# Patient Record
Sex: Female | Born: 1950 | Race: White | Hispanic: No | Marital: Single | Smoking: Never smoker
Health system: Southern US, Community
[De-identification: ages and names within clinical notes are randomized; demographics above are authoritative.]

## PROBLEM LIST (undated history)

## (undated) HISTORY — PX: EXCISION OF BREAST BIOPSY: SHX5822

---

## 2018-04-27 DIAGNOSIS — C439 Malignant melanoma of skin, unspecified: Secondary | ICD-10-CM

## 2018-04-27 DIAGNOSIS — D239 Other benign neoplasm of skin, unspecified: Secondary | ICD-10-CM

## 2018-04-27 HISTORY — DX: Other benign neoplasm of skin, unspecified: D23.9

## 2018-04-27 HISTORY — DX: Malignant melanoma of skin, unspecified: C43.9

## 2020-01-03 ENCOUNTER — Ambulatory Visit: Payer: Self-pay | Admitting: Dermatology

## 2020-01-10 ENCOUNTER — Ambulatory Visit (INDEPENDENT_AMBULATORY_CARE_PROVIDER_SITE_OTHER): Payer: Medicare (Managed Care) | Admitting: Dermatology

## 2020-01-10 ENCOUNTER — Other Ambulatory Visit: Payer: Self-pay

## 2020-01-10 DIAGNOSIS — Z85820 Personal history of malignant melanoma of skin: Secondary | ICD-10-CM

## 2020-01-10 DIAGNOSIS — D229 Melanocytic nevi, unspecified: Secondary | ICD-10-CM

## 2020-01-10 DIAGNOSIS — Z1283 Encounter for screening for malignant neoplasm of skin: Secondary | ICD-10-CM

## 2020-01-10 DIAGNOSIS — L578 Other skin changes due to chronic exposure to nonionizing radiation: Secondary | ICD-10-CM

## 2020-01-10 DIAGNOSIS — L308 Other specified dermatitis: Secondary | ICD-10-CM

## 2020-01-10 DIAGNOSIS — L738 Other specified follicular disorders: Secondary | ICD-10-CM

## 2020-01-10 DIAGNOSIS — L814 Other melanin hyperpigmentation: Secondary | ICD-10-CM

## 2020-01-10 DIAGNOSIS — L821 Other seborrheic keratosis: Secondary | ICD-10-CM

## 2020-01-10 DIAGNOSIS — L820 Inflamed seborrheic keratosis: Secondary | ICD-10-CM

## 2020-01-10 DIAGNOSIS — D1800 Hemangioma unspecified site: Secondary | ICD-10-CM

## 2020-01-10 DIAGNOSIS — L304 Erythema intertrigo: Secondary | ICD-10-CM

## 2020-01-10 MED ORDER — KETOCONAZOLE 2 % EX CREA: 1.0000 "application " | TOPICAL_CREAM | Freq: Two times a day (BID) | CUTANEOUS | 1 refills | Status: AC

## 2020-01-10 MED ORDER — TRIAMCINOLONE ACETONIDE 0.1 % EX OINT: TOPICAL_OINTMENT | CUTANEOUS | 1 refills | Status: DC

## 2020-01-10 NOTE — Progress Notes (Signed)
Follow-Up Visit   Subjective  Penny Gutierrez is a 69 y.o. female who presents for the following: Annual Exam (history of MM, DN).  Patient here today for full body skin exam and skin cancer screening. She has a history of melanoma and dysplastic nevi. Spot at left chest has gotten bigger. There are some spots on her neck and chest that itch and burn. Rash at left arm that is itchy present for a few weeks.   Melanoma was treated by Dr. Johnnette Litter at [facility].   The following portions of the chart were reviewed this encounter and updated as appropriate:  Tobacco  Allergies  Meds  Problems  Med Hx  Surg Hx  Fam Hx      Review of Systems:  No other skin or systemic complaints except as noted in HPI or Assessment and Plan.  Objective  Well appearing patient in no apparent distress; mood and affect are within normal limits.  A full examination was performed including scalp, head, eyes, ears, nose, lips, neck, chest, axillae, abdomen, back, buttocks, bilateral upper extremities, bilateral lower extremities, hands, feet, fingers, toes, fingernails, and toenails. All findings within normal limits unless otherwise noted below.  Objective  R chest x 1, L neck x 1, ant neck x 1, L thigh x 4 (7): Erythematous keratotic or waxy stuck-on papule or plaque.   Objective  Left Antecubital Fossa: Scaly erythematous papules and patches +/- dyspigmentation, lichenification, excoriations.   Objective  Inframammary: Erythematous patches  Objective  Face: Yellow papules   Assessment & Plan  Inflamed seborrheic keratosis (7) R chest x 1, L neck x 1, ant neck x 1, L thigh x 4  Destruction of lesion - R chest x 1, L neck x 1, ant neck x 1, L thigh x 4  Destruction method: cryotherapy   Informed consent: discussed and consent obtained   Lesion destroyed using liquid nitrogen: Yes   Cryotherapy cycles:  2 Outcome: patient tolerated procedure well with no complications     Post-procedure details: wound care instructions given    Other eczema Left Antecubital Fossa  Start TMC 0.1% ointment to affected area twice daily until clear. Avoid face, groin, underarms.   Topical steroids (such as triamcinolone, fluocinolone, fluocinonide, mometasone, clobetasol, halobetasol, betamethasone, hydrocortisone) can cause thinning and lightening of the skin if they are used for too long in the same area. Your physician has selected the right strength medicine for your problem and area affected on the body. Please use your medication only as directed by your physician to prevent side effects.    Ordered Medications: triamcinolone ointment (KENALOG) 0.1 %  Erythema intertrigo Inframammary  Start TMC 0.1% ointment twice a day to affected areas chest up to 2-3 weeks, under breasts up to 1 week. Avoid face, groin, underarms.   Start ketoconazole 2% cream twice a day to under breasts  Ordered Medications: ketoconazole (NIZORAL) 2 % cream  Sebaceous hyperplasia Face  Benign, observe.     Lentigines - Scattered tan macules - Discussed due to sun exposure - Benign, observe - Call for any changes  Seborrheic Keratoses - Stuck-on, waxy, tan-brown papules and plaques  - Discussed benign etiology and prognosis. - Observe - Call for any changes  Melanocytic Nevi - Tan-brown and/or pink-flesh-colored symmetric macules and papules - Benign appearing on exam today - Observation - Call clinic for new or changing moles - Recommend daily use of broad spectrum spf 30+ sunscreen to sun-exposed areas.   Hemangiomas -  Red papules - Discussed benign nature - Observe - Call for any changes  Actinic Damage - diffuse scaly erythematous macules with underlying dyspigmentation - Recommend daily broad spectrum sunscreen SPF 30+ to sun-exposed areas, reapply every 2 hours as needed.  - Call for new or changing lesions.  Skin cancer screening performed today.  History of  Melanoma - No evidence of recurrence today at left chest, Breslow's Depth 0.66mm treated at [facility] by Dr. Dorita Fray - No lymphadenopathy - Recommend regular full body skin exams - Recommend daily broad spectrum sunscreen SPF 30+ to sun-exposed areas, reapply every 2 hours as needed.  - Call if any new or changing lesions are noted between office visits  History of Dysplastic Nevi - No evidence of recurrence today ar right upper arm - Recommend regular full body skin exams - Recommend daily broad spectrum sunscreen SPF 30+ to sun-exposed areas, reapply every 2 hours as needed.  - Call if any new or changing lesions are noted between office visits   Return in about 4 months (around 05/11/2020) for TBSE.  Graciella Belton, RMA, am acting as scribe for Forest Gleason, MD .  Documentation: I have reviewed the above documentation for accuracy and completeness, and I agree with the above.  Forest Gleason, MD

## 2020-01-10 NOTE — Patient Instructions (Addendum)
Cryotherapy Aftercare  . Wash gently with soap and water everyday.   Marland Kitchen Apply Vaseline and Band-Aid daily until healed.  Seborrheic Keratosis  What causes seborrheic keratoses? Seborrheic keratoses are harmless, common skin growths that first appear during adult life.  As time goes by, more growths appear.  Some people may develop a large number of them.  Seborrheic keratoses appear on both covered and uncovered body parts.  They are not caused by sunlight.  The tendency to develop seborrheic keratoses can be inherited.  They vary in color from skin-colored to gray, brown, or even black.  They can be either smooth or have a rough, warty surface.   Seborrheic keratoses are superficial and look as if they were stuck on the skin.  Under the microscope this type of keratosis looks like layers upon layers of skin.  That is why at times the top layer may seem to fall off, but the rest of the growth remains and re-grows.    Treatment Seborrheic keratoses do not need to be treated, but can easily be removed in the office.  Seborrheic keratoses often cause symptoms when they rub on clothing or jewelry.  Lesions can be in the way of shaving.  If they become inflamed, they can cause itching, soreness, or burning.  Removal of a seborrheic keratosis can be accomplished by freezing, burning, or surgery. If any spot bleeds, scabs, or grows rapidly, please return to have it checked, as these can be an indication of a skin cancer.  Recommend daily broad spectrum sunscreen SPF 30+ to sun-exposed areas, reapply every 2 hours as needed. Call for new or changing lesions.  Topical steroids (such as triamcinolone, fluocinolone, fluocinonide, mometasone, clobetasol, halobetasol, betamethasone, hydrocortisone) can cause thinning and lightening of the skin if they are used for too long in the same area. Your physician has selected the right strength medicine for your problem and area affected on the body. Please use your  medication only as directed by your physician to prevent side effects.

## 2020-01-27 ENCOUNTER — Encounter: Payer: Self-pay | Admitting: Dermatology

## 2020-05-07 ENCOUNTER — Other Ambulatory Visit: Payer: Self-pay

## 2020-05-07 ENCOUNTER — Encounter: Payer: Self-pay | Admitting: Dermatology

## 2020-05-07 ENCOUNTER — Ambulatory Visit (INDEPENDENT_AMBULATORY_CARE_PROVIDER_SITE_OTHER): Payer: Medicare (Managed Care) | Admitting: Dermatology

## 2020-05-07 DIAGNOSIS — L304 Erythema intertrigo: Secondary | ICD-10-CM

## 2020-05-07 DIAGNOSIS — L905 Scar conditions and fibrosis of skin: Secondary | ICD-10-CM

## 2020-05-07 DIAGNOSIS — Z1283 Encounter for screening for malignant neoplasm of skin: Secondary | ICD-10-CM

## 2020-05-07 DIAGNOSIS — D1800 Hemangioma unspecified site: Secondary | ICD-10-CM

## 2020-05-07 DIAGNOSIS — L309 Dermatitis, unspecified: Secondary | ICD-10-CM

## 2020-05-07 DIAGNOSIS — Z85820 Personal history of malignant melanoma of skin: Secondary | ICD-10-CM

## 2020-05-07 DIAGNOSIS — D229 Melanocytic nevi, unspecified: Secondary | ICD-10-CM

## 2020-05-07 DIAGNOSIS — L578 Other skin changes due to chronic exposure to nonionizing radiation: Secondary | ICD-10-CM

## 2020-05-07 DIAGNOSIS — D2262 Melanocytic nevi of left upper limb, including shoulder: Secondary | ICD-10-CM

## 2020-05-07 DIAGNOSIS — L821 Other seborrheic keratosis: Secondary | ICD-10-CM

## 2020-05-07 DIAGNOSIS — Z86018 Personal history of other benign neoplasm: Secondary | ICD-10-CM

## 2020-05-07 DIAGNOSIS — L814 Other melanin hyperpigmentation: Secondary | ICD-10-CM

## 2020-05-07 MED ORDER — TRIAMCINOLONE ACETONIDE 0.1 % EX OINT: TOPICAL_OINTMENT | CUTANEOUS | 3 refills | Status: DC

## 2020-05-07 MED ORDER — HYDROCORTISONE 2.5 % EX CREA: TOPICAL_CREAM | CUTANEOUS | 3 refills | Status: AC

## 2020-05-07 NOTE — Progress Notes (Signed)
Follow-Up Visit   Subjective  Penny Gutierrez is a 69 y.o. female who presents for the following: TBSE (4 mo TBSE. Hx of melanoma. Hx of dysplastic nevi. ).  She notes itchy rash at her neck, chest and ears present for awhile, bothersome.  The following portions of the chart were reviewed this encounter and updated as appropriate:  Tobacco  Allergies  Meds  Problems  Med Hx  Surg Hx  Fam Hx      Review of Systems: No other skin or systemic complaints except as noted in HPI or Assessment and Plan.   Objective  Well appearing patient in no apparent distress; mood and affect are within normal limits.  A full examination was performed including scalp, head, eyes, ears, nose, lips, neck, chest, axillae, abdomen, back, buttocks, bilateral upper extremities, bilateral lower extremities, hands, feet, fingers, toes, fingernails, and toenails. All findings within normal limits unless otherwise noted below.  Objective  Left Upper Arm: Well healed scar.  Objective  Left upper inner arm: 0.7 cm thin dark brown papule   Images      Objective  Neck, chest, ears: Scaly erythematous papules and patches  Objective  bilateral inframammary: Erythematous patches  Assessment & Plan  Scar Left Upper Arm  Benign-appearing.  Observation.  Call clinic for new or changing lesions.  Recommend daily use of broad spectrum spf 30+ sunscreen to sun-exposed areas.    Nevus Left upper inner arm  Benign-appearing.  Observation.  Call clinic for new or changing lesions.  Recommend daily use of broad spectrum spf 30+ sunscreen to sun-exposed areas.   Recheck on f/u  Eczema, unspecified type Neck, chest, ears  Chronic condition with expected duration over one year. Condition is bothersome to patient. Currently flared.  Start TMC 0.1% ointment. Apply to affected areas on neck, chest, and ears BID for up to 2 weeks at a time. Stop if rash resolves or itching subsides. Repeat as needed.  Do not use for more than 2 weeks out of the month total each month.   Topical steroids (such as triamcinolone, fluocinolone, fluocinonide, mometasone, clobetasol, halobetasol, betamethasone, hydrocortisone) can cause thinning and lightening of the skin if they are used for too long in the same area. Your physician has selected the right strength medicine for your problem and area affected on the body. Please use your medication only as directed by your physician to prevent side effects.     triamcinolone ointment (KENALOG) 0.1 % - Neck, chest, ears  Erythema intertrigo bilateral inframammary  Start HC 2.5% cream BID to bilateral inframammary area for up to 2 weeks at a time. Stop if rash resolves. Repeat as needed. Do not use for more than 2 weeks total out of each month.      hydrocortisone 2.5 % cream - bilateral inframammary    Lentigines - Scattered tan macules - Discussed due to sun exposure - Benign, observe - Call for any changes  Seborrheic Keratoses - Stuck-on, waxy, tan-brown papules and plaques  - Discussed benign etiology and prognosis. - Observe - Call for any changes  Melanocytic Nevi - Tan-brown and/or pink-flesh-colored symmetric macules and papules - Benign appearing on exam today - Observation - Call clinic for new or changing moles - Recommend daily use of broad spectrum spf 30+ sunscreen to sun-exposed areas.   Hemangiomas - Red papules - Discussed benign nature - Observe - Call for any changes  Actinic Damage - Chronic, secondary to cumulative UV/sun exposure - diffuse scaly erythematous  macules with underlying dyspigmentation - Recommend daily broad spectrum sunscreen SPF 30+ to sun-exposed areas, reapply every 2 hours as needed.  - Call for new or changing lesions.  Skin cancer screening performed today.  History of Melanoma - No evidence of recurrence today at left chest, Breslow's Depth 0.33mm treated at [facility] by Dr. Dorita Fray -  No evidence of recurrence today - No lymphadenopathy - Recommend regular full body skin exams - Recommend daily broad spectrum sunscreen SPF 30+ to sun-exposed areas, reapply every 2 hours as needed.  - Call if any new or changing lesions are noted between office visits  History of Dysplastic Nevi - No evidence of recurrence today - Recommend regular full body skin exams - Recommend daily broad spectrum sunscreen SPF 30+ to sun-exposed areas, reapply every 2 hours as needed.  - Call if any new or changing lesions are noted between office visits   Return in about 6 months (around 11/05/2020) for TBSE.   I, Harriett Sine, CMA, am acting as scribe for Forest Gleason, MD.  Documentation: I have reviewed the above documentation for accuracy and completeness, and I agree with the above.  Forest Gleason, MD

## 2020-05-07 NOTE — Patient Instructions (Addendum)
Recommend Gold Bond Rapid Relief Anti-Itch cream up to 3 times per day to areas that are itchy.   Start HC 2.5% cream BID to bilateral inframammaries for up to 2 weeks at a time. Stop if rash resolves. Repeat as needed. Do not use for more than 2 weeks total out of each month.    Start TMC 0.1% ointment. Apply to affected areas on neck, chest, and ears BID for up to 2 weeks at a time. Stop if rash resolves or itching subsides. Repeat as needed. Do not use for more than 2 weeks out of the month total each month.

## 2020-06-04 ENCOUNTER — Encounter: Payer: Self-pay | Admitting: Dermatology

## 2020-10-01 ENCOUNTER — Other Ambulatory Visit: Payer: Self-pay

## 2020-10-01 ENCOUNTER — Ambulatory Visit (INDEPENDENT_AMBULATORY_CARE_PROVIDER_SITE_OTHER): Payer: Medicare (Managed Care) | Admitting: Dermatology

## 2020-10-01 ENCOUNTER — Encounter: Payer: Self-pay | Admitting: Dermatology

## 2020-10-01 DIAGNOSIS — L570 Actinic keratosis: Secondary | ICD-10-CM | POA: Diagnosis not present

## 2020-10-01 DIAGNOSIS — Z1283 Encounter for screening for malignant neoplasm of skin: Secondary | ICD-10-CM | POA: Diagnosis not present

## 2020-10-01 DIAGNOSIS — L814 Other melanin hyperpigmentation: Secondary | ICD-10-CM

## 2020-10-01 DIAGNOSIS — Z86018 Personal history of other benign neoplasm: Secondary | ICD-10-CM

## 2020-10-01 DIAGNOSIS — D2262 Melanocytic nevi of left upper limb, including shoulder: Secondary | ICD-10-CM | POA: Diagnosis not present

## 2020-10-01 DIAGNOSIS — Z85820 Personal history of malignant melanoma of skin: Secondary | ICD-10-CM | POA: Diagnosis not present

## 2020-10-01 DIAGNOSIS — D229 Melanocytic nevi, unspecified: Secondary | ICD-10-CM

## 2020-10-01 DIAGNOSIS — D1800 Hemangioma unspecified site: Secondary | ICD-10-CM

## 2020-10-01 DIAGNOSIS — L578 Other skin changes due to chronic exposure to nonionizing radiation: Secondary | ICD-10-CM

## 2020-10-01 DIAGNOSIS — L821 Other seborrheic keratosis: Secondary | ICD-10-CM

## 2020-10-01 NOTE — Progress Notes (Signed)
Follow-Up Visit   Subjective  Penny Gutierrez is a 70 y.o. female who presents for the following: FBSE (Patient here for full body skin exam and skin cancer screening. Patient with hx of MM and dysplastic nevi. She is not aware of any new or changing spots. ). She does have a spot at the right ear that is bothersome.  Patient accompanied by niece Nathaniel Man.  The following portions of the chart were reviewed this encounter and updated as appropriate:   Tobacco  Allergies  Meds  Problems  Med Hx  Surg Hx  Fam Hx      Review of Systems:  No other skin or systemic complaints except as noted in HPI or Assessment and Plan.  Objective  Well appearing patient in no apparent distress; mood and affect are within normal limits.  A full examination was performed including scalp, head, eyes, ears, nose, lips, neck, chest, axillae, abdomen, back, buttocks, bilateral upper extremities, bilateral lower extremities, hands, feet, fingers, toes, fingernails, and toenails. All findings within normal limits unless otherwise noted below.  Objective  Left Upper Arm near Axilla: 0.7cm medium brown to dark brown thin papule,slightly darker centrally  Objective  Right helix: Erythematous thin papules/macules with gritty scale.    Assessment & Plan  Nevus Left Upper Arm near Axilla  Benign-appearing.  Observation.  Call clinic for new or changing lesions.  Recommend daily use of broad spectrum spf 30+ sunscreen to sun-exposed areas.    AK (actinic keratosis) Right helix  Prior to procedure, discussed risks of blister formation, small wound, skin dyspigmentation, or rare scar following cryotherapy.    Destruction of lesion - Right helix  Destruction method: cryotherapy   Informed consent: discussed and consent obtained   Lesion destroyed using liquid nitrogen: Yes   Cryotherapy cycles:  2 Outcome: patient tolerated procedure well with no complications   Post-procedure details:  wound care instructions given     Lentigines - Scattered tan macules - Due to sun exposure - Benign-appering, observe - Recommend daily broad spectrum sunscreen SPF 30+ to sun-exposed areas, reapply every 2 hours as needed. - Call for any changes  Seborrheic Keratoses - Stuck-on, waxy, tan-brown papules and/or plaques  - Benign-appearing - Discussed benign etiology and prognosis. - Observe - Call for any changes  Melanocytic Nevi - Tan-brown and/or pink-flesh-colored symmetric macules and papules - Benign appearing on exam today - Observation - Call clinic for new or changing moles - Recommend daily use of broad spectrum spf 30+ sunscreen to sun-exposed areas.   Hemangiomas - Red papules - Discussed benign nature - Observe - Call for any changes  Actinic Damage - Chronic condition, secondary to cumulative UV/sun exposure - diffuse scaly erythematous macules with underlying dyspigmentation - Recommend daily broad spectrum sunscreen SPF 30+ to sun-exposed areas, reapply every 2 hours as needed.  - Staying in the shade or wearing long sleeves, sun glasses (UVA+UVB protection) and wide brim hats (4-inch brim around the entire circumference of the hat) are also recommended for sun protection.  - Call for new or changing lesions.  Skin cancer screening performed today.  History of Dysplastic Nevi - No evidence of recurrence today - Recommend regular full body skin exams - Recommend daily broad spectrum sunscreen SPF 30+ to sun-exposed areas, reapply every 2 hours as needed.  - Call if any new or changing lesions are noted between office visits  History of Melanoma - No evidence of recurrence today at left chest, Breslow's 0.10mm, clark's level  2 - No lymphadenopathy - Recommend regular full body skin exams - Recommend daily broad spectrum sunscreen SPF 30+ to sun-exposed areas, reapply every 2 hours as needed.  - Call if any new or changing lesions are noted between office  visits   Return in about 3 months (around 12/31/2020) for AK follow up and recheck nevus at left arm, 6 month TBSE.  Graciella Belton, RMA, am acting as scribe for Forest Gleason, MD .  Documentation: I have reviewed the above documentation for accuracy and completeness, and I agree with the above.  Forest Gleason, MD

## 2020-10-01 NOTE — Patient Instructions (Addendum)
Recommend OTC Gold Bond Rapid Relief Anti-Itch cream (pramoxine + menthol) up to 3 times per day to areas that are itchy.  Cryotherapy Aftercare  . Wash gently with soap and water everyday.   Marland Kitchen Apply Vaseline and Band-Aid daily until healed.  Prior to procedure, discussed risks of blister formation, small wound, skin dyspigmentation, or rare scar following cryotherapy.    Melanoma ABCDEs  Melanoma is the most dangerous type of skin cancer, and is the leading cause of death from skin disease.  You are more likely to develop melanoma if you:  Have light-colored skin, light-colored eyes, or red or blond hair  Spend a lot of time in the sun  Tan regularly, either outdoors or in a tanning bed  Have had blistering sunburns, especially during childhood  Have a close family member who has had a melanoma  Have atypical moles or large birthmarks  Early detection of melanoma is key since treatment is typically straightforward and cure rates are extremely high if we catch it early.   The first sign of melanoma is often a change in a mole or a new dark spot.  The ABCDE system is a way of remembering the signs of melanoma.  A for asymmetry:  The two halves do not match. B for border:  The edges of the growth are irregular. C for color:  A mixture of colors are present instead of an even brown color. D for diameter:  Melanomas are usually (but not always) greater than 74mm - the size of a pencil eraser. E for evolution:  The spot keeps changing in size, shape, and color.  Please check your skin once per month between visits. You can use a small mirror in front and a large mirror behind you to keep an eye on the back side or your body.   If you see any new or changing lesions before your next follow-up, please call to schedule a visit.  Please continue daily skin protection including broad spectrum sunscreen SPF 30+ to sun-exposed areas, reapplying every 2 hours as needed when you're outdoors.    If you have any questions or concerns for your doctor, please call our main line at [phone] and press option 4 to reach your doctor's medical assistant. If no one answers, please leave a voicemail as directed and we will return your call as soon as possible. Messages left after 4 pm will be answered the following business day.   You may also send [place] a message via [place]. We typically respond to MyChart messages within 1-2 business days.  For prescription refills, please ask your pharmacy to contact our office. Our fax number is [phone].  If you have an urgent issue when the clinic is closed that cannot wait until the next business day, you can page your doctor at the number below.    Please note that while we do our best to be available for urgent issues outside of office hours, we are not available 24/7.   If you have an urgent issue and are unable to reach [place], you may choose to seek medical care at your doctor's office, retail clinic, urgent care center, or emergency room.  If you have a medical emergency, please immediately call 911 or go to the emergency department.  Pager Numbers  - Dr. Nehemiah Massed: [phone]  - Dr. Laurence Ferrari: [phone]  - Dr. Nicole Kindred: [phone]  In the event of inclement weather, please call our main line at [phone] for an update on the  status of any delays or closures.  Dermatology Medication Tips: Please keep the boxes that topical medications come in in order to help keep track of the instructions about where and how to use these. Pharmacies typically print the medication instructions only on the boxes and not directly on the medication tubes.   If your medication is too expensive, please contact our office at [phone] option 4 or send [place] a message through [place].   We are unable to tell what your co-pay for medications will be in advance as this is different depending on your insurance coverage. However, we may be able to find a  substitute medication at lower cost or fill out paperwork to get insurance to cover a needed medication.   If a prior authorization is required to get your medication covered by your insurance company, please allow [place] 1-2 business days to complete this process.  Drug prices often vary depending on where the prescription is filled and some pharmacies may offer cheaper prices.  The website www.goodrx.com contains coupons for medications through different pharmacies. The prices here do not account for what the cost may be with help from insurance (it may be cheaper with your insurance), but the website can give you the price if you did not use any insurance.  - You can print the associated coupon and take it with your prescription to the pharmacy.  - You may also stop by our office during regular business hours and pick up a GoodRx coupon card.  - If you need your prescription sent electronically to a different pharmacy, notify our office through [facility] or by phone at [phone] option 4.

## 2020-12-29 ENCOUNTER — Other Ambulatory Visit: Payer: Self-pay | Admitting: Internal Medicine

## 2020-12-29 DIAGNOSIS — Z1231 Encounter for screening mammogram for malignant neoplasm of breast: Secondary | ICD-10-CM

## 2021-01-14 ENCOUNTER — Encounter: Payer: Self-pay | Admitting: Dermatology

## 2021-01-14 ENCOUNTER — Other Ambulatory Visit: Payer: Self-pay

## 2021-01-14 ENCOUNTER — Ambulatory Visit (INDEPENDENT_AMBULATORY_CARE_PROVIDER_SITE_OTHER): Payer: Medicare (Managed Care) | Admitting: Dermatology

## 2021-01-14 DIAGNOSIS — D2262 Melanocytic nevi of left upper limb, including shoulder: Secondary | ICD-10-CM | POA: Diagnosis not present

## 2021-01-14 DIAGNOSIS — D492 Neoplasm of unspecified behavior of bone, soft tissue, and skin: Secondary | ICD-10-CM

## 2021-01-14 DIAGNOSIS — D229 Melanocytic nevi, unspecified: Secondary | ICD-10-CM

## 2021-01-14 DIAGNOSIS — D485 Neoplasm of uncertain behavior of skin: Secondary | ICD-10-CM

## 2021-01-14 DIAGNOSIS — L570 Actinic keratosis: Secondary | ICD-10-CM

## 2021-01-14 NOTE — Patient Instructions (Signed)

## 2021-01-14 NOTE — Progress Notes (Signed)
   Follow-Up Visit   Subjective  Penny Gutierrez is a 70 y.o. female who presents for the following: Follow-up (3 month recheck. AK Tx site. Right helix. Tx with LN2) and Nevus (Recheck mole. Left Upper Arm near Axilla.).  Sister-in-law with patient  The following portions of the chart were reviewed this encounter and updated as appropriate:  Tobacco  Allergies  Meds  Problems  Med Hx  Surg Hx  Fam Hx      Review of Systems: No other skin or systemic complaints except as noted in HPI or Assessment and Plan.   Objective  Well appearing patient in no apparent distress; mood and affect are within normal limits.  A focused examination was performed including face, left upper arm. Relevant physical exam findings are noted in the Assessment and Plan.  left upper arm near axilla 0.7cm medium brown to dark brown thin papule,slightly darker centrally at left upper arm near axilla   Right Helix Clear today.  Right Thigh - Anterior 0.2 cm excoriation with prominent telangietasias       Assessment & Plan  Nevus left upper arm near axilla  Benign-appearing.  Observation.  Call clinic for new or changing lesions.  Recommend daily use of broad spectrum spf 30+ sunscreen to sun-exposed areas.    AK (actinic keratosis) Right Helix  Resolved, watch for recurrence.   Actinic keratoses are precancerous spots that appear secondary to cumulative UV radiation exposure/sun exposure over time. They are chronic with expected duration over 1 year. A portion of actinic keratoses will progress to squamous cell carcinoma of the skin. It is not possible to reliably predict which spots will progress to skin cancer and so treatment is recommended to prevent development of skin cancer.  Recommend daily broad spectrum sunscreen SPF 30+ to sun-exposed areas, reapply every 2 hours as needed.  Recommend staying in the shade or wearing long sleeves, sun glasses (UVA+UVB protection) and wide brim hats  (4-inch brim around the entire circumference of the hat). Call for new or changing lesions.  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 Deferred today. Will recheck at TBSE in 3 months.    Return for TBSE in 3 months.  I, Emelia Salisbury, CMA, am acting as scribe for Forest Gleason, MD.  Documentation: I have reviewed the above documentation for accuracy and completeness, and I agree with the above.  Forest Gleason, MD

## 2021-01-21 ENCOUNTER — Other Ambulatory Visit: Payer: Self-pay

## 2021-01-21 ENCOUNTER — Ambulatory Visit
Admission: RE | Admit: 2021-01-21 | Discharge: 2021-01-21 | Disposition: A | Discharge status: Home / Self Care | Payer: Medicare (Managed Care) | Source: Ambulatory Visit | Attending: Internal Medicine | Admitting: Internal Medicine

## 2021-01-21 DIAGNOSIS — Z1231 Encounter for screening mammogram for malignant neoplasm of breast: Secondary | ICD-10-CM | POA: Diagnosis present

## 2021-01-23 ENCOUNTER — Inpatient Hospital Stay
Admission: RE | Admit: 2021-01-23 | Discharge: 2021-01-23 | Disposition: A | Discharge status: Home / Self Care | Payer: Self-pay | Source: Ambulatory Visit | Attending: *Deleted | Admitting: *Deleted

## 2021-01-23 ENCOUNTER — Other Ambulatory Visit: Payer: Self-pay | Admitting: *Deleted

## 2021-01-23 DIAGNOSIS — Z1231 Encounter for screening mammogram for malignant neoplasm of breast: Secondary | ICD-10-CM

## 2021-01-24 ENCOUNTER — Encounter: Payer: Self-pay | Admitting: Dermatology

## 2021-04-23 ENCOUNTER — Ambulatory Visit (INDEPENDENT_AMBULATORY_CARE_PROVIDER_SITE_OTHER): Payer: Medicare (Managed Care) | Admitting: Dermatology

## 2021-04-23 ENCOUNTER — Other Ambulatory Visit: Payer: Self-pay

## 2021-04-23 DIAGNOSIS — D485 Neoplasm of uncertain behavior of skin: Secondary | ICD-10-CM

## 2021-04-23 DIAGNOSIS — D225 Melanocytic nevi of trunk: Secondary | ICD-10-CM

## 2021-04-23 DIAGNOSIS — Z85820 Personal history of malignant melanoma of skin: Secondary | ICD-10-CM

## 2021-04-23 DIAGNOSIS — D229 Melanocytic nevi, unspecified: Secondary | ICD-10-CM

## 2021-04-23 DIAGNOSIS — L814 Other melanin hyperpigmentation: Secondary | ICD-10-CM

## 2021-04-23 DIAGNOSIS — Z1283 Encounter for screening for malignant neoplasm of skin: Secondary | ICD-10-CM

## 2021-04-23 DIAGNOSIS — L578 Other skin changes due to chronic exposure to nonionizing radiation: Secondary | ICD-10-CM | POA: Diagnosis not present

## 2021-04-23 DIAGNOSIS — D1800 Hemangioma unspecified site: Secondary | ICD-10-CM

## 2021-04-23 DIAGNOSIS — L821 Other seborrheic keratosis: Secondary | ICD-10-CM

## 2021-04-23 DIAGNOSIS — D224 Melanocytic nevi of scalp and neck: Secondary | ICD-10-CM

## 2021-04-23 DIAGNOSIS — B078 Other viral warts: Secondary | ICD-10-CM | POA: Diagnosis not present

## 2021-04-23 DIAGNOSIS — L820 Inflamed seborrheic keratosis: Secondary | ICD-10-CM | POA: Diagnosis not present

## 2021-04-23 DIAGNOSIS — Z86018 Personal history of other benign neoplasm: Secondary | ICD-10-CM

## 2021-04-23 NOTE — Progress Notes (Signed)
Follow-Up Visit   Subjective  Penny Gutierrez is a 70 y.o. female who presents for the following: FBSE (Patient here for full body skin exam and skin cancer screening. Patient with hx of melanoma, dysplastic nevus. Patient not aware of any new or changing spots. ).   The following portions of the chart were reviewed this encounter and updated as appropriate:   Tobacco  Allergies  Meds  Problems  Med Hx  Surg Hx  Fam Hx      Review of Systems:  No other skin or systemic complaints except as noted in HPI or Assessment and Plan.  Objective  Well appearing patient in no apparent distress; mood and affect are within normal limits.  A full examination was performed including scalp, head, eyes, ears, nose, lips, neck, chest, axillae, abdomen, back, buttocks, bilateral upper extremities, bilateral lower extremities, hands, feet, fingers, toes, fingernails, and toenails. All findings within normal limits unless otherwise noted below.  left upper arm near axilla 0.7cm medium brown to dark brown thin papule,slightly darker centrally at left upper arm near axilla   left neck Verrucous papules -- Discussed viral etiology and contagion.   intermammary x 6, mid back right of midline x 1 (7) Erythematous keratotic or waxy stuck-on papule or plaque.   left upper back 0.3 cm dark brown thin papule 2 slightly darker foci      Assessment & Plan  Nevus left upper arm near axilla  Photo compared, stable  Benign-appearing.  Observation.  Call clinic for new or changing lesions.  Recommend daily use of broad spectrum spf 30+ sunscreen to sun-exposed areas.    Other viral warts left neck  Discussed viral etiology and risk of spread.  Discussed multiple treatments may be required to clear warts.  Discussed possible post-treatment dyspigmentation and risk of recurrence.  Prior to procedure, discussed risks of blister formation, small wound, skin dyspigmentation, or rare scar following  cryotherapy. Recommend Vaseline ointment to treated areas while healing.   Destruction of lesion - left neck  Destruction method: cryotherapy   Informed consent: discussed and consent obtained   Lesion destroyed using liquid nitrogen: Yes   Cryotherapy cycles:  2 Outcome: patient tolerated procedure well with no complications   Post-procedure details: wound care instructions given    Inflamed seborrheic keratosis intermammary x 6, mid back right of midline x 1  Symptomatic  Prior to procedure, discussed risks of blister formation, small wound, skin dyspigmentation, or rare scar following cryotherapy. Recommend Vaseline ointment to treated areas while healing.   Destruction of lesion - intermammary x 6, mid back right of midline x 1  Destruction method: cryotherapy   Informed consent: discussed and consent obtained   Lesion destroyed using liquid nitrogen: Yes   Cryotherapy cycles:  2 Outcome: patient tolerated procedure well with no complications   Post-procedure details: wound care instructions given    Neoplasm of uncertain behavior of skin left upper back  Epidermal / dermal shaving  Lesion diameter (cm):  0.3 Informed consent: discussed and consent obtained   Timeout: patient name, date of birth, surgical site, and procedure verified   Patient was prepped and draped in usual sterile fashion: area prepped with isopropyl alcohol. Anesthesia: the lesion was anesthetized in a standard fashion   Anesthetic:  1% lidocaine w/ epinephrine 1-100,000 buffered w/ 8.4% NaHCO3 Instrument used: flexible razor blade   Hemostasis achieved with: aluminum chloride   Outcome: patient tolerated procedure well   Post-procedure details: wound care instructions given  Additional details:  Mupirocin and a bandage applied  Specimen 1 - Surgical pathology Differential Diagnosis: r/o Atypia  Check Margins: No 0.3 cm dark brown thin papule 2 slightly darker foci  Lentigines - Scattered  tan macules - Due to sun exposure - Benign-appearing, observe - Recommend daily broad spectrum sunscreen SPF 30+ to sun-exposed areas, reapply every 2 hours as needed. - Call for any changes  Seborrheic Keratoses - Stuck-on, waxy, tan-brown papules and/or plaques  - Benign-appearing - Discussed benign etiology and prognosis. - Observe - Call for any changes  Melanocytic Nevi - Tan-brown and/or pink-flesh-colored symmetric macules and papules - Benign appearing on exam today - Observation - Call clinic for new or changing moles - Recommend daily use of broad spectrum spf 30+ sunscreen to sun-exposed areas.   Hemangiomas - Red papules - Discussed benign nature - Observe - Call for any changes  Actinic Damage - Chronic condition, secondary to cumulative UV/sun exposure - diffuse scaly erythematous macules with underlying dyspigmentation - Recommend daily broad spectrum sunscreen SPF 30+ to sun-exposed areas, reapply every 2 hours as needed.  - Staying in the shade or wearing long sleeves, sun glasses (UVA+UVB protection) and wide brim hats (4-inch brim around the entire circumference of the hat) are also recommended for sun protection.  - Call for new or changing lesions.  Skin cancer screening performed today.  History of Dysplastic Nevi - No evidence of recurrence today - Recommend regular full body skin exams - Recommend daily broad spectrum sunscreen SPF 30+ to sun-exposed areas, reapply every 2 hours as needed.  - Call if any new or changing lesions are noted between office visits  History of Melanoma - No evidence of recurrence today - No lymphadenopathy - Recommend regular full body skin exams - Recommend daily broad spectrum sunscreen SPF 30+ to sun-exposed areas, reapply every 2 hours as needed.  - Call if any new or changing lesions are noted between office visits  Return in about 6 months (around 10/21/2021) for TBSE.  Graciella Belton, RMA, am acting as  scribe for Forest Gleason, MD .  Documentation: I have reviewed the above documentation for accuracy and completeness, and I agree with the above.  Forest Gleason, MD

## 2021-04-23 NOTE — Patient Instructions (Addendum)
Cryotherapy Aftercare  Wash gently with soap and water everyday.   Apply Vaseline and Band-Aid daily until healed.   Prior to procedure, discussed risks of blister formation, small wound, skin dyspigmentation, or rare scar following cryotherapy. Recommend Vaseline ointment to treated areas while healing.   Wound Care Instructions  Cleanse wound gently with soap and water once a day then pat dry with clean gauze. Apply a thing coat of Petrolatum (petroleum jelly, "Vaseline") over the wound (unless you have an allergy to this). We recommend that you use a new, sterile tube of Vaseline. Do not pick or remove scabs. Do not remove the yellow or white "healing tissue" from the base of the wound.  Cover the wound with fresh, clean, nonstick gauze and secure with paper tape. You may use Band-Aids in place of gauze and tape if the would is small enough, but would recommend trimming much of the tape off as there is often too much. Sometimes Band-Aids can irritate the skin.  You should call the office for your biopsy report after 1 week if you have not already been contacted.  If you experience any problems, such as abnormal amounts of bleeding, swelling, significant bruising, significant pain, or evidence of infection, please call the office immediately.  FOR ADULT SURGERY PATIENTS: If you need something for pain relief you may take 1 extra strength Tylenol (acetaminophen) AND 2 Ibuprofen (200mg  each) together every 4 hours as needed for pain. (do not take these if you are allergic to them or if you have a reason you should not take them.) Typically, you may only need pain medication for 1 to 3 days.    Melanoma ABCDEs  Melanoma is the most dangerous type of skin cancer, and is the leading cause of death from skin disease.  You are more likely to develop melanoma if you: Have light-colored skin, light-colored eyes, or red or blond hair Spend a lot of time in the sun Tan regularly, either outdoors or  in a tanning bed Have had blistering sunburns, especially during childhood Have a close family member who has had a melanoma Have atypical moles or large birthmarks  Early detection of melanoma is key since treatment is typically straightforward and cure rates are extremely high if we catch it early.   The first sign of melanoma is often a change in a mole or a new dark spot.  The ABCDE system is a way of remembering the signs of melanoma.  A for asymmetry:  The two halves do not match. B for border:  The edges of the growth are irregular. C for color:  A mixture of colors are present instead of an even brown color. D for diameter:  Melanomas are usually (but not always) greater than 13mm - the size of a pencil eraser. E for evolution:  The spot keeps changing in size, shape, and color.  Please check your skin once per month between visits. You can use a small mirror in front and a large mirror behind you to keep an eye on the back side or your body.   If you see any new or changing lesions before your next follow-up, please call to schedule a visit.  Please continue daily skin protection including broad spectrum sunscreen SPF 30+ to sun-exposed areas, reapplying every 2 hours as needed when you're outdoors.    If You Need Anything After Your Visit  If you have any questions or concerns for your doctor, please call our main line at  [phone] and press option 4 to reach your doctor's medical assistant. If no one answers, please leave a voicemail as directed and we will return your call as soon as possible. Messages left after 4 pm will be answered the following business day.   You may also send [place] a message via [place]. We typically respond to MyChart messages within 1-2 business days.  For prescription refills, please ask your pharmacy to contact our office. Our fax number is [phone].  If you have an urgent issue when the clinic is closed that cannot wait until the next business  day, you can page your doctor at the number below.    Please note that while we do our best to be available for urgent issues outside of office hours, we are not available 24/7.   If you have an urgent issue and are unable to reach [place], you may choose to seek medical care at your doctor's office, retail clinic, urgent care center, or emergency room.  If you have a medical emergency, please immediately call 911 or go to the emergency department.  Pager Numbers  - Dr. Nehemiah Massed: [phone]  - Dr. Laurence Ferrari: [phone]  - Dr. Nicole Kindred: [phone]  In the event of inclement weather, please call our main line at [phone] for an update on the status of any delays or closures.  Dermatology Medication Tips: Please keep the boxes that topical medications come in in order to help keep track of the instructions about where and how to use these. Pharmacies typically print the medication instructions only on the boxes and not directly on the medication tubes.   If your medication is too expensive, please contact our office at [phone] option 4 or send [place] a message through [place].   We are unable to tell what your co-pay for medications will be in advance as this is different depending on your insurance coverage. However, we may be able to find a substitute medication at lower cost or fill out paperwork to get insurance to cover a needed medication.   If a prior authorization is required to get your medication covered by your insurance company, please allow [place] 1-2 business days to complete this process.  Drug prices often vary depending on where the prescription is filled and some pharmacies may offer cheaper prices.  The website www.goodrx.com contains coupons for medications through different pharmacies. The prices here do not account for what the cost may be with help from insurance (it may be cheaper with your insurance), but the website can give you the price if you did not use any  insurance.  - You can print the associated coupon and take it with your prescription to the pharmacy.  - You may also stop by our office during regular business hours and pick up a GoodRx coupon card.  - If you need your prescription sent electronically to a different pharmacy, notify our office through [facility] or by phone at [phone] option 4.     Si Usted Necesita Algo Despus de Su Visita  Tambin puede enviarnos un mensaje a travs de Pharmacist, community. Por lo general respondemos a los mensajes de MyChart en el transcurso de 1 a 2 das hbiles.  Para renovar recetas, por favor pida a su farmacia que se ponga en contacto con nuestra oficina. Harland Dingwall de fax es [place] [phone].  Si tiene un asunto urgente cuando la clnica est cerrada y que no puede esperar hasta el siguiente da hbil, puede llamar/localizar a su doctor(a)  al nmero que aparece a continuacin.   Por favor, tenga en cuenta que aunque hacemos todo lo posible para estar disponibles para asuntos urgentes fuera del horario de [place], no estamos disponibles las 24 horas del da, los 7 das de la [place].   Si tiene un problema urgente y no puede comunicarse con nosotros, puede optar por buscar atencin mdica  en el consultorio de su doctor(a), en una clnica privada, en un centro de atencin urgente o en una sala de emergencias.  Si tiene Engineering geologist, por favor llame inmediatamente al 911 o vaya a la sala de emergencias.  Nmeros de bper  - Dr. Nehemiah Massed: [phone]  - Dra. Moye: [phone]  - Dra. Nicole Kindred: [phone]  En caso de inclemencias del [place], por favor llame a Johnsie Kindred principal al [phone] para una actualizacin sobre el [place] de cualquier retraso o cierre.  Consejos para la medicacin en dermatologa: Por favor, guarde las cajas en las que vienen los medicamentos de uso tpico para ayudarle a seguir las instrucciones sobre dnde y cmo usarlos. Las farmacias  generalmente imprimen las instrucciones del medicamento slo en las cajas y no directamente en los tubos del [place].   Si su medicamento es muy caro, por favor, pngase en contacto con Zigmund Daniel llamando al [phone] y presione la opcin 4 o envenos un mensaje a travs de Pharmacist, community.   No podemos decirle cul ser su copago por los medicamentos por adelantado ya que esto es diferente dependiendo de la cobertura de su seguro. Sin embargo, es posible que podamos encontrar un medicamento sustituto a Electrical engineer un formulario para que el seguro cubra el medicamento que se considera necesario.   Si se requiere una autorizacin previa para que su compaa de seguros [place] su medicamento, por favor permtanos de 1 a 2 das hbiles para completar este proceso.  Los precios de los medicamentos varan con frecuencia dependiendo del Environmental consultant de dnde se surte la receta y alguna farmacias pueden ofrecer precios ms baratos.  El sitio web www.goodrx.com tiene cupones para medicamentos de Airline pilot. Los precios aqu no tienen en cuenta lo que podra costar con la ayuda del seguro (puede ser ms barato con su seguro), pero el sitio web puede darle el precio si no utiliz Research scientist (physical sciences).  - Puede imprimir el cupn correspondiente y llevarlo con su receta a la farmacia.  - Tambin puede pasar por nuestra oficina durante el horario de atencin regular y Charity fundraiser una tarjeta de cupones de GoodRx.  - Si necesita que su receta se enve electrnicamente a una farmacia diferente, informe a nuestra oficina a travs de MyChart de [place] o por telfono llamando al [phone] y presione la opcin 4.

## 2021-05-05 ENCOUNTER — Encounter: Payer: Self-pay | Admitting: Dermatology

## 2021-05-06 ENCOUNTER — Telehealth: Payer: Self-pay

## 2021-05-06 NOTE — Telephone Encounter (Addendum)
  Tried calling patient regarding results. No answer. Left message to call back.    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 On the spectrum from normal mole to melanoma skin cancer, this is in between the two but closer towards a melanoma skin cancer.  - The treatment of choice for severely atypical moles is to cut them out in clinic with an area of normal looking skin around them to get all the atypical cells out. The skin that is removed will be sent to check under the microscope again to be sure it looks completely out.   - People who have a history of atypical moles do have a slightly increased risk of developing melanoma somewhere on the body, so a full body skin exam by a dermatologist is recommended at least once a year. - Monthly self skin checks and daily sun protection are also recommended.  - Please also call if you notice any new or changing spots anywhere else on the body before your follow-up visit.   MAs please call and schedule for excision. Thank you!

## 2021-05-06 NOTE — Telephone Encounter (Signed)
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 On the spectrum from normal mole to melanoma skin cancer, this is in between the two but closer towards a melanoma skin cancer.  - The treatment of choice for severely atypical moles is to cut them out in clinic with an area of normal looking skin around them to get all the atypical cells out. The skin that is removed will be sent to check under the microscope again to be sure it looks completely out.   - People who have a history of atypical moles do have a slightly increased risk of developing melanoma somewhere on the body, so a full body skin exam by a dermatologist is recommended at least once a year. - Monthly self skin checks and daily sun protection are also recommended.  - Please also call if you notice any new or changing spots anywhere else on the body before your follow-up visit.   MAs please call and schedule for excision. Thank you!

## 2021-05-06 NOTE — Telephone Encounter (Signed)
Patient's niece advised of BX information and scheduled for surgery.

## 2021-05-20 ENCOUNTER — Ambulatory Visit (INDEPENDENT_AMBULATORY_CARE_PROVIDER_SITE_OTHER): Payer: Medicare (Managed Care) | Admitting: Dermatology

## 2021-05-20 ENCOUNTER — Other Ambulatory Visit: Payer: Self-pay

## 2021-05-20 DIAGNOSIS — D225 Melanocytic nevi of trunk: Secondary | ICD-10-CM | POA: Diagnosis not present

## 2021-05-20 DIAGNOSIS — D485 Neoplasm of uncertain behavior of skin: Secondary | ICD-10-CM

## 2021-05-20 MED ORDER — MUPIROCIN 2 % EX OINT: 1.0000 "application " | TOPICAL_OINTMENT | Freq: Every day | CUTANEOUS | 0 refills | Status: AC

## 2021-05-20 NOTE — Patient Instructions (Addendum)

## 2021-05-20 NOTE — Progress Notes (Signed)
Follow-Up Visit   Subjective  Penny Gutierrez is a 70 y.o. 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 ).  Patient accompanied by niece, Colletta [place] patient's POA.   The following portions of the chart were reviewed this encounter and updated as appropriate:   Tobacco   Allergies   Meds   Problems   Med Hx   Surg Hx   Fam Hx       Review of Systems:  No other skin or systemic complaints except as noted in HPI or Assessment and Plan.  Objective  Well appearing patient in no apparent distress; mood and affect are within normal limits.  A focused examination was performed including back. Relevant physical exam findings are noted in the Assessment and Plan.  Left Upper Back Healing bx site    Assessment & Plan  Neoplasm of uncertain behavior of skin Left Upper Back  Skin excision  Lesion length (cm):  0.9 Margin per side (cm):  0.5 Total excision diameter (cm):  1.9 Informed consent: discussed and consent obtained   Timeout: patient name, date of birth, surgical site, and procedure verified   Procedure prep:  Patient was prepped and draped in usual sterile fashion Prep type:  Chlorhexidine Anesthesia: the lesion was anesthetized in a standard fashion   Anesthetic:  1% lidocaine w/ epinephrine 1-100,000 buffered w/ 8.4% NaHCO3 (3cc lido w/epi, 9cc 0.25% bupivicaine) Instrument used: #15 blade   Hemostasis achieved with: suture, pressure and electrodesiccation   Outcome: patient tolerated procedure well with no complications   Post-procedure details: wound care instructions given   Additional details:  Mupirocin and a pressure dressing applied  Skin repair Complexity:  Intermediate Final length (cm):  5.8 Informed consent: discussed and consent obtained   Timeout: patient name, date of birth, surgical site, and procedure verified   Procedure  prep:  Patient was prepped and draped in usual sterile fashion Prep type:  Chlorhexidine Anesthesia: the lesion was anesthetized in a standard fashion   Anesthetic:  1% lidocaine w/ epinephrine 1-100,000 local infiltration Reason for type of repair: reduce tension to allow closure, reduce the risk of dehiscence, infection, and necrosis, reduce subcutaneous dead space and avoid a hematoma, allow closure of the large defect, preserve normal anatomy, preserve normal anatomical and functional relationships and enhance both functionality and cosmetic results   Undermining: edges could be approximated without difficulty and edges undermined   Subcutaneous layers (deep stitches):  Suture size:  3-0 Suture type: Vicryl (polyglactin 910) and PDS (polydioxanone)   Stitches:  Buried vertical mattress Fine/surface layer approximation (top stitches):  Suture size:  4-0 Suture type: Prolene (polypropylene)   Suture removal (days):  14 Hemostasis achieved with: suture, pressure and electrodesiccation Outcome: patient tolerated procedure well with no complications   Post-procedure details: wound care instructions given   Additional details:  Mupirocin and a pressure bandage applied   mupirocin ointment (BACTROBAN) 2 % Apply 1 application topically daily. 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  Graciella Belton, RMA, am acting as scribe for Forest Gleason, MD .   Documentation: I have reviewed the above documentation for accuracy and completeness, and I agree with the above.  Forest Gleason, MD

## 2021-05-21 ENCOUNTER — Telehealth: Payer: Self-pay

## 2021-05-21 NOTE — Telephone Encounter (Signed)
Left msg for patient's niece to see how she was doing after yesterday's surgery.Cherly Hensen

## 2021-05-26 ENCOUNTER — Telehealth: Payer: Self-pay

## 2021-05-26 NOTE — Telephone Encounter (Signed)
-----   Message from [place], MD sent at 05/26/2021  3:04 PM EST ----- Skin (M), left upper back NO RESIDUAL DYSPLASTIC NEVUS, MARGINS FREE  Entire lesion appears to be out. No additional treatment needed at this time. Please call our office [phone] with any questions.   MAs please call. Thank you!

## 2021-05-26 NOTE — Telephone Encounter (Signed)
Left msg margins clear, keep suture removal appt as scheduled.Penny Gutierrez

## 2021-06-03 ENCOUNTER — Ambulatory Visit (INDEPENDENT_AMBULATORY_CARE_PROVIDER_SITE_OTHER): Payer: Medicare (Managed Care) | Admitting: Dermatology

## 2021-06-03 ENCOUNTER — Other Ambulatory Visit: Payer: Self-pay

## 2021-06-03 DIAGNOSIS — L309 Dermatitis, unspecified: Secondary | ICD-10-CM

## 2021-06-03 DIAGNOSIS — Z86018 Personal history of other benign neoplasm: Secondary | ICD-10-CM

## 2021-06-03 MED ORDER — TRIAMCINOLONE ACETONIDE 0.1 % EX CREA: TOPICAL_CREAM | CUTANEOUS | 0 refills | Status: AC

## 2021-06-03 NOTE — Patient Instructions (Addendum)
Gentle Skin Care Guide  1. Bathe no more than once a day.  2. Avoid bathing in hot water  3. Use a mild soap like Dove, Vanicream, Cetaphil, CeraVe. Can use Lever 2000 or Cetaphil antibacterial soap  4. Use soap only where you need it. On most days, use it under your arms, between your legs, and on your feet. Let the water rinse other areas unless visibly dirty.  5. When you get out of the bath/shower, use a towel to gently blot your skin dry, don't rub it.  6. While your skin is still a little damp, apply a moisturizing cream such as Vanicream, CeraVe, Cetaphil, Eucerin, Sarna lotion or plain Vaseline Jelly. For hands apply Neutrogena [place] Hand Cream or Excipial Hand Cream.  7. Reapply moisturizer any time you start to itch or feel dry.  8. Sometimes using free and clear laundry detergents can be helpful. Fabric softener sheets should be avoided. Downy Free & Gentle liquid, or any liquid fabric softener that is free of dyes and perfumes, it acceptable to use  If You Need Anything After Your Visit  If you have any questions or concerns for your doctor, please call our main line at [phone] and press option 4 to reach your doctor's medical assistant. If no one answers, please leave a voicemail as directed and we will return your call as soon as possible. Messages left after 4 pm will be answered the following business day.   You may also send [place] a message via [place]. We typically respond to MyChart messages within 1-2 business days.  For prescription refills, please ask your pharmacy to contact our office. Our fax number is [phone].  If you have an urgent issue when the clinic is closed that cannot wait until the next business day, you can page your doctor at the number below.    Please note that while we do our best to be available for urgent issues outside of office hours, we are not available 24/7.   If you have an urgent issue and are unable to reach [place], you may choose  to seek medical care at your doctor's office, retail clinic, urgent care center, or emergency room.  If you have a medical emergency, please immediately call 911 or go to the emergency department.  Pager Numbers  - Dr. Nehemiah Massed: [phone]  - Dr. Laurence Ferrari: [phone]  - Dr. Nicole Kindred: [phone]  In the event of inclement weather, please call our main line at [phone] for an update on the status of any delays or closures.  Dermatology Medication Tips: Please keep the boxes that topical medications come in in order to help keep track of the instructions about where and how to use these. Pharmacies typically print the medication instructions only on the boxes and not directly on the medication tubes.   If your medication is too expensive, please contact our office at [phone] option 4 or send [place] a message through [place].   We are unable to tell what your co-pay for medications will be in advance as this is different depending on your insurance coverage. However, we may be able to find a substitute medication at lower cost or fill out paperwork to get insurance to cover a needed medication.   If a prior authorization is required to get your medication covered by your insurance company, please allow [place] 1-2 business days to complete this process.  Drug prices often vary depending on where the prescription is filled and some pharmacies may offer  cheaper prices.  The website www.goodrx.com contains coupons for medications through different pharmacies. The prices here do not account for what the cost may be with help from insurance (it may be cheaper with your insurance), but the website can give you the price if you did not use any insurance.  - You can print the associated coupon and take it with your prescription to the pharmacy.  - You may also stop by our office during regular business hours and pick up a GoodRx coupon card.  - If you need your prescription sent electronically to a  different pharmacy, notify our office through [facility] or by phone at [phone] option 4.     Si Usted Necesita Algo Despus de Su Visita  Tambin puede enviarnos un mensaje a travs de Pharmacist, community. Por lo general respondemos a los mensajes de MyChart en el transcurso de 1 a 2 das hbiles.  Para renovar recetas, por favor pida a su farmacia que se ponga en contacto con nuestra oficina. Harland Dingwall de fax es [place] [phone].  Si tiene un asunto urgente cuando la clnica est cerrada y que no puede esperar hasta el siguiente da hbil, puede llamar/localizar a su doctor(a) al nmero que aparece a continuacin.   Por favor, tenga en cuenta que aunque hacemos todo lo posible para estar disponibles para asuntos urgentes fuera del horario de [place], no estamos disponibles las 24 horas del da, los 7 das de la [place].   Si tiene un problema urgente y no puede comunicarse con nosotros, puede optar por buscar atencin mdica  en el consultorio de su doctor(a), en una clnica privada, en un centro de atencin urgente o en una sala de emergencias.  Si tiene Engineering geologist, por favor llame inmediatamente al 911 o vaya a la sala de emergencias.  Nmeros de bper  - Dr. Nehemiah Massed: [phone]  - Dra. Moye: [phone]  - Dra. Nicole Kindred: [phone]  En caso de inclemencias del [place], por favor llame a Johnsie Kindred principal al [phone] para una actualizacin sobre el [place] de cualquier retraso o cierre.  Consejos para la medicacin en dermatologa: Por favor, guarde las cajas en las que vienen los medicamentos de uso tpico para ayudarle a seguir las instrucciones sobre dnde y cmo usarlos. Las farmacias generalmente imprimen las instrucciones del medicamento slo en las cajas y no directamente en los tubos del [place].   Si su medicamento es muy caro, por favor, pngase en contacto con Zigmund Daniel llamando al [phone] y presione la opcin 4 o envenos un  mensaje a travs de Pharmacist, community.   No podemos decirle cul ser su copago por los medicamentos por adelantado ya que esto es diferente dependiendo de la cobertura de su seguro. Sin embargo, es posible que podamos encontrar un medicamento sustituto a Electrical engineer un formulario para que el seguro cubra el medicamento que se considera necesario.   Si se requiere una autorizacin previa para que su compaa de seguros [place] su medicamento, por favor permtanos de 1 a 2 das hbiles para completar este proceso.  Los precios de los medicamentos varan con frecuencia dependiendo del Environmental consultant de dnde se surte la receta y alguna farmacias pueden ofrecer precios ms baratos.  El sitio web www.goodrx.com tiene cupones para medicamentos de Airline pilot. Los precios aqu no tienen en cuenta lo que podra costar con la ayuda del seguro (puede ser ms barato con su seguro), pero el sitio web puede darle el precio si no utiliz ningn  seguro.  - Puede imprimir el cupn correspondiente y llevarlo con su receta a la farmacia.  - Tambin puede pasar por nuestra oficina durante el horario de atencin regular y Charity fundraiser una tarjeta de cupones de GoodRx.  - Si necesita que su receta se enve electrnicamente a Chiropodist, informe a nuestra oficina a travs de MyChart de [place] o por telfono llamando al [phone] y presione la opcin 4.   9. If your doctor has given you prescription creams you may apply moisturizers over them

## 2021-06-03 NOTE — Progress Notes (Signed)
° ° °  Follow-Up Visit   Subjective  Penny Gutierrez is a 70 y.o. female who presents for the following: post op/suture removal (For pathology proven margins free severely dysplastic nevus on the L upper back - patient is here today for suture removal.) and Itching (On the lower abdomen - patient has been using OTC Gold Bond itch relief cream but still c/o significant itching. ).   The following portions of the chart were reviewed this encounter and updated as appropriate:   Tobacco   Allergies   Meds   Problems   Med Hx   Surg Hx   Fam Hx       Review of Systems:  No other skin or systemic complaints except as noted in HPI or Assessment and Plan.  Objective  Well appearing patient in no apparent distress; mood and affect are within normal limits.  A focused examination was performed including the back and lower abdomen. Relevant physical exam findings are noted in the Assessment and Plan.  Lower abdomen Xerotic patches  Left Upper Back Healing excision site.     Assessment & Plan  Dermatitis Lower abdomen  Chronic condition with duration over one year, recurrent. Condition is bothersome to patient. Currently flared.  With significant pruritis -   Start TMC 0.1% cream BID up to two weeks. Then PRN thereafter. Topical steroids (such as triamcinolone, fluocinolone, fluocinonide, mometasone, clobetasol, halobetasol, betamethasone, hydrocortisone) can cause thinning and lightening of the skin if they are used for too long in the same area. Your physician has selected the right strength medicine for your problem and area affected on the body. Please use your medication only as directed by your physician to prevent side effects.   Continue OTC Gold Bond itch relief cream daily as needed.   triamcinolone cream (KENALOG) 0.1 % - Lower abdomen Apply to itchy areas on abdomen BID up to two weeks then PRN thereafter. Do not use on the face, under the arms, groin, or inframammary  areas.  History of dysplastic nevus Left Upper Back  Encounter for Removal of Sutures - Incision site at the L upper back is clean, dry and intact - Wound cleansed, sutures removed and wound cleansed. - Discussed pathology results showing margins free severely dysplastic nevus.  - Patient advised to keep steri-strips dry until they fall off. - Scars remodel for a full year. - Once steri-strips fall off, patient can apply over-the-counter silicone scar cream each night to help with scar remodeling if desired. - Patient advised to call with any concerns or if they notice any new or changing lesions.    Return for appointment as scheduled - TBSE .  Luther Redo, CMA, am acting as scribe for Forest Gleason, MD .  Documentation: I have reviewed the above documentation for accuracy and completeness, and I agree with the above.  Forest Gleason, MD

## 2021-06-04 ENCOUNTER — Encounter: Payer: Self-pay | Admitting: Dermatology

## 2021-10-27 ENCOUNTER — Encounter: Payer: Medicare (Managed Care) | Admitting: Dermatology

## 2022-07-14 ENCOUNTER — Ambulatory Visit (INDEPENDENT_AMBULATORY_CARE_PROVIDER_SITE_OTHER): Payer: Medicare (Managed Care) | Admitting: Dermatology

## 2022-07-14 ENCOUNTER — Encounter: Payer: Self-pay | Admitting: Dermatology

## 2022-07-14 DIAGNOSIS — D229 Melanocytic nevi, unspecified: Secondary | ICD-10-CM

## 2022-07-14 DIAGNOSIS — B078 Other viral warts: Secondary | ICD-10-CM | POA: Diagnosis not present

## 2022-07-14 DIAGNOSIS — L578 Other skin changes due to chronic exposure to nonionizing radiation: Secondary | ICD-10-CM

## 2022-07-14 DIAGNOSIS — Z85820 Personal history of malignant melanoma of skin: Secondary | ICD-10-CM | POA: Diagnosis not present

## 2022-07-14 DIAGNOSIS — D2262 Melanocytic nevi of left upper limb, including shoulder: Secondary | ICD-10-CM | POA: Diagnosis not present

## 2022-07-14 DIAGNOSIS — Z1283 Encounter for screening for malignant neoplasm of skin: Secondary | ICD-10-CM

## 2022-07-14 DIAGNOSIS — Z86018 Personal history of other benign neoplasm: Secondary | ICD-10-CM

## 2022-07-14 DIAGNOSIS — L814 Other melanin hyperpigmentation: Secondary | ICD-10-CM

## 2022-07-14 DIAGNOSIS — L821 Other seborrheic keratosis: Secondary | ICD-10-CM

## 2022-07-14 NOTE — Progress Notes (Unsigned)
Follow-Up Visit   Subjective  Penny Gutierrez is a 72 y.o. female who presents for the following: FBSE (Hx MM, dysplastic nevus.Patient has a few spots behind right ear and at left neck that itch sometimes and she would like checked. ).  The patient presents for Total-Body Skin Exam (TBSE) for skin cancer screening and mole check.  The patient has spots, moles and lesions to be evaluated, some may be new or changing and the patient has concerns that these could be cancer.  Patient accompanied by caregiver.    The following portions of the chart were reviewed this encounter and updated as appropriate:   Tobacco  Allergies  Meds  Problems  Med Hx  Surg Hx  Fam Hx      Review of Systems:  No other skin or systemic complaints except as noted in HPI or Assessment and Plan.  Objective  Well appearing patient in no apparent distress; mood and affect are within normal limits.  A full examination was performed including scalp, head, eyes, ears, nose, lips, neck, chest, axillae, abdomen, back, buttocks, bilateral upper extremities, bilateral lower extremities, hands, feet, fingers, toes, fingernails, and toenails. All findings within normal limits unless otherwise noted below.  right cheek x 1 Verrucous papules -- Discussed viral etiology and contagion.   Left Upper Arm 0.7cm medium brown to dark brown thin papule,slightly darker centrally     Assessment & Plan  Other viral warts right cheek x 1  Viral Wart (HPV) Counseling  Discussed viral / HPV (Human Papilloma Virus) etiology and risk of spread /infectivity to other areas of body as well as to other people.  Multiple treatments and methods may be required to clear warts and it is possible treatment may not be successful.  Treatment risks include discoloration; scarring and there is still potential for wart recurrence.  Prior to procedure, discussed risks of blister formation, small wound, skin dyspigmentation, or rare scar  following cryotherapy. Recommend Vaseline ointment to treated areas while healing.   Destruction of lesion - right cheek x 1  Destruction method: cryotherapy   Informed consent: discussed and consent obtained   Lesion destroyed using liquid nitrogen: Yes   Cryotherapy cycles:  2 Outcome: patient tolerated procedure well with no complications   Post-procedure details: wound care instructions given    Nevus Left Upper Arm  Benign-appearing. Stable compared to previous visit. Observation.  Call clinic for new or changing moles.  Recommend daily use of broad spectrum spf 30+ sunscreen to sun-exposed areas.     History of Dysplastic Nevi - No evidence of recurrence today - Recommend regular full body skin exams - Recommend daily broad spectrum sunscreen SPF 30+ to sun-exposed areas, reapply every 2 hours as needed.  - Call if any new or changing lesions are noted between office visits  History of Melanoma - No evidence of recurrence today - No lymphadenopathy - Recommend regular full body skin exams - Recommend daily broad spectrum sunscreen SPF 30+ to sun-exposed areas, reapply every 2 hours as needed.  - Call if any new or changing lesions are noted between office visits  Lentigines - Scattered tan macules - Due to sun exposure - Benign-appearing, observe - Recommend daily broad spectrum sunscreen SPF 30+ to sun-exposed areas, reapply every 2 hours as needed. - Call for any changes  Seborrheic Keratoses - Stuck-on, waxy, tan-brown papules and/or plaques  - Benign-appearing - Discussed benign etiology and prognosis. - Observe - Call for any changes  Melanocytic Nevi -  Tan-brown and/or pink-flesh-colored symmetric macules and papules - Benign appearing on exam today - Observation - Call clinic for new or changing moles - Recommend daily use of broad spectrum spf 30+ sunscreen to sun-exposed areas.   Hemangiomas - Red papules - Discussed benign nature - Observe -  Call for any changes  Actinic Damage - Chronic condition, secondary to cumulative UV/sun exposure - diffuse scaly erythematous macules with underlying dyspigmentation - Recommend daily broad spectrum sunscreen SPF 30+ to sun-exposed areas, reapply every 2 hours as needed.  - Staying in the shade or wearing long sleeves, sun glasses (UVA+UVB protection) and wide brim hats (4-inch brim around the entire circumference of the hat) are also recommended for sun protection.  - Call for new or changing lesions.  Skin cancer screening performed today.   Inflamed Seborrheic keratoses  Benign-appearing.  Call clinic for new or changing lesions.   Symptomatic per patient. Inflamed on exam.  Prior to procedure, discussed risks of blister formation, small wound, skin dyspigmentation, or rare scar following cryotherapy. Recommend Vaseline ointment to treated areas while healing.  Procedure Note: Cryotherapy Destruction method: cryotherapy   Informed consent: discussed and consent obtained   Timeout:  patient name, date of birth, surgical site, and procedure verified Lesion destroyed using liquid nitrogen: Yes   Region frozen until ice ball extended beyond lesion: No Outcome: patient tolerated procedure well with no complications   Post-procedure details: wound care instructions given   Locations Treated: left neck x 1, left medial breast x 1 # Treated: 2   Return in about 1 year (around 07/15/2023) for TBSE, Hx MM, Hx Dysplastic Nevi.  Graciella Belton, RMA, am acting as scribe for Forest Gleason, MD .  Documentation: I have reviewed the above documentation for accuracy and completeness, and I agree with the above.  Forest Gleason, MD

## 2022-07-14 NOTE — Patient Instructions (Addendum)
Cryotherapy Aftercare  Wash gently with soap and water everyday.   Apply Vaseline and Band-Aid daily until healed.   Recommend OTC Gold Bond Rapid Relief Anti-Itch cream (pramoxine + menthol), CeraVe Anti-itch cream or lotion (pramoxine), Sarna lotion (Original- menthol + camphor or Sensitive- pramoxine) or Eucerin 12 hour Itch Relief lotion (menthol) up to 3 times per day to areas on body that are itchy.  Recommend Vitamin D 800iu daily.  Recommend VaniCream moisturizing cream  Melanoma ABCDEs  Melanoma is the most dangerous type of skin cancer, and is the leading cause of death from skin disease.  You are more likely to develop melanoma if you: Have light-colored skin, light-colored eyes, or red or blond hair Spend a lot of time in the sun Tan regularly, either outdoors or in a tanning bed Have had blistering sunburns, especially during childhood Have a close family member who has had a melanoma Have atypical moles or large birthmarks  Early detection of melanoma is key since treatment is typically straightforward and cure rates are extremely high if we catch it early.   The first sign of melanoma is often a change in a mole or a new dark spot.  The ABCDE system is a way of remembering the signs of melanoma.  A for asymmetry:  The two halves do not match. B for border:  The edges of the growth are irregular. C for color:  A mixture of colors are present instead of an even brown color. D for diameter:  Melanomas are usually (but not always) greater than 26m - the size of a pencil eraser. E for evolution:  The spot keeps changing in size, shape, and color.  Please check your skin once per month between visits. You can use a small mirror in front and a large mirror behind you to keep an eye on the back side or your body.   If you see any new or changing lesions before your next follow-up, please call to schedule a visit.  Please continue daily skin protection including broad spectrum  sunscreen SPF 30+ to sun-exposed areas, reapplying every 2 hours as needed when you're outdoors.    Due to recent changes in healthcare laws, you may see results of your pathology and/or laboratory studies on MyChart before the doctors have had a chance to review them. We understand that in some cases there may be results that are confusing or concerning to you. Please understand that not all results are received at the same time and often the doctors may need to interpret multiple results in order to provide you with the best plan of care or course of treatment. Therefore, we ask that you please give u[place]2 business days to thoroughly review all your results before contacting the office for clarification. Should we see a critical lab result, you will be contacted sooner.   If You Need Anything After Your Visit  If you have any questions or concerns for your doctor, please call our main line at [phone] press option 4 to reach your doctor's medical assistant. If no one answers, please leave a voicemail as directed and we will return your call as soon as possible. Messages left after 4 pm will be answered the following business day.   You may also send [place] message via [place] We typically respond to MyChart messages within 1-2 business days.  For prescription refills, please ask your pharmacy to contact our office. Our fax number is [phone]  If you have  an urgent issue when the clinic is closed that cannot wait until the next business day, you can page your doctor at the number below.    Please note that while we do our best to be available for urgent issues outside of office hours, we are not available 24/7.   If you have an urgent issue and are unable to reach [place], you may choose to seek medical care at your doctor's office, retail clinic, urgent care center, or emergency room.  If you have a medical emergency, please immediately call 911 or go to the emergency department.  Pager  Numbers  - Dr. Nehemiah Massed: [phone]  - Dr. Laurence Ferrari: [phone]  - Dr. Nicole Kindred: [phone]  In the event of inclement weather, please call our main line at [phone] for an update on the status of any delays or closures.  Dermatology Medication Tips: Please keep the boxes that topical medications come in in order to help keep track of the instructions about where and how to use these. Pharmacies typically print the medication instructions only on the boxes and not directly on the medication tubes.   If your medication is too expensive, please contact our office at [phone] option 4 or send [place] a message through [place].   We are unable to tell what your co-pay for medications will be in advance as this is different depending on your insurance coverage. However, we may be able to find a substitute medication at lower cost or fill out paperwork to get insurance to cover a needed medication.   If a prior authorization is required to get your medication covered by your insurance company, please allow [place] 1-2 business days to complete this process.  Drug prices often vary depending on where the prescription is filled and some pharmacies may offer cheaper prices.  The website www.goodrx.com contains coupons for medications through different pharmacies. The prices here do not account for what the cost may be with help from insurance (it may be cheaper with your insurance), but the website can give you the price if you did not use any insurance.  - You can print the associated coupon and take it with your prescription to the pharmacy.  - You may also stop by our office during regular business hours and pick up a GoodRx coupon card.  - If you need your prescription sent electronically to a different pharmacy, notify our office through [facility] or by phone at [phone] option 4.     Si Usted Necesita Algo Despus de Su Visita  Tambin puede enviarnos un mensaje a travs de  Pharmacist, community. Por lo general respondemos a los mensajes de MyChart en el transcurso de 1 a 2 das hbiles.  Para renovar recetas, por favor pida a su farmacia que se ponga en contacto con nuestra oficina. Harland Dingwall de fax es [place] [phone].  Si tiene un asunto urgente cuando la clnica est cerrada y que no puede esperar hasta el siguiente da hbil, puede llamar/localizar a su doctor(a) al nmero que aparece a continuacin.   Por favor, tenga en cuenta que aunque hacemos todo lo posible para estar disponibles para asuntos urgentes fuera del horario de [place], no estamos disponibles las 24 horas del da, los 7 das de la [employer].   Si tiene un problema urgente y no puede comunicarse con nosotros, puede optar por buscar atencin mdica  en el consultorio de su doctor(a), en una clnica privada, en un centro de atencin urgente o en una sala de  emergencias.  Si tiene Engineering geologist, por favor llame inmediatamente al 911 o vaya a la sala de emergencias.  Nmeros de bper  - Dr. Nehemiah Massed: [phone]  - Dra. Moye: [phone]  - Dra. Nicole Kindred: [phone]  En caso de inclemencias del [place], por favor llame a Johnsie Kindred principal al [phone] para una actualizacin sobre el [place] de cualquier retraso o cierre.  Consejos para la medicacin en dermatologa: Por favor, guarde las cajas en las que vienen los medicamentos de uso tpico para ayudarle a seguir las instrucciones sobre dnde y cmo usarlos. Las farmacias generalmente imprimen las instrucciones del medicamento slo en las cajas y no directamente en los tubos del [place].   Si su medicamento es muy caro, por favor, pngase en contacto con Zigmund Daniel llamando al [phone] y presione la opcin 4 o envenos un mensaje a travs de Pharmacist, community.   No podemos decirle cul ser su copago por los medicamentos por adelantado ya que esto es diferente dependiendo de la cobertura de su seguro. Sin embargo, es posible que  podamos encontrar un medicamento sustituto a Electrical engineer un formulario para que el seguro cubra el medicamento que se considera necesario.   Si se requiere una autorizacin previa para que su compaa de seguros [place] su medicamento, por favor permtanos de 1 a 2 das hbiles para completar este proceso.  Los precios de los medicamentos varan con frecuencia dependiendo del Environmental consultant de dnde se surte la receta y alguna farmacias pueden ofrecer precios ms baratos.  El sitio web www.goodrx.com tiene cupones para medicamentos de Airline pilot. Los precios aqu no tienen en cuenta lo que podra costar con la ayuda del seguro (puede ser ms barato con su seguro), pero el sitio web puede darle el precio si no utiliz Research scientist (physical sciences).  - Puede imprimir el cupn correspondiente y llevarlo con su receta a la farmacia.  - Tambin puede pasar por nuestra oficina durante el horario de atencin regular y Charity fundraiser una tarjeta de cupones de GoodRx.  - Si necesita que su receta se enve electrnicamente a una farmacia diferente, informe a nuestra oficina a travs de MyChart de  o por telfono llamando al [phone] y presione la opcin 4.

## 2022-07-15 ENCOUNTER — Encounter: Payer: Self-pay | Admitting: Dermatology

## 2022-12-03 ENCOUNTER — Other Ambulatory Visit: Payer: Self-pay | Admitting: Nurse Practitioner

## 2022-12-03 DIAGNOSIS — Z1231 Encounter for screening mammogram for malignant neoplasm of breast: Secondary | ICD-10-CM

## 2022-12-23 ENCOUNTER — Ambulatory Visit
Admission: RE | Admit: 2022-12-23 | Discharge: 2022-12-23 | Disposition: A | Discharge status: Home / Self Care | Payer: Medicare (Managed Care) | Source: Ambulatory Visit | Attending: Nurse Practitioner | Admitting: Nurse Practitioner

## 2022-12-23 ENCOUNTER — Encounter: Payer: Self-pay | Admitting: Radiology

## 2022-12-23 DIAGNOSIS — Z1231 Encounter for screening mammogram for malignant neoplasm of breast: Secondary | ICD-10-CM | POA: Diagnosis present

## 2023-03-13 IMAGING — MG MM DIGITAL SCREENING BILAT W/ TOMO AND CAD
6 of 12 series · 6 of 36 positions shown · non-contrast
Comparison: Previous exam(s).

CLINICAL DATA: Screening.

EXAM:
DIGITAL SCREENING BILATERAL MAMMOGRAM WITH TOMOSYNTHESIS AND CAD
TECHNIQUE: Bilateral screening digital craniocaudal and mediolateral oblique
mammograms were obtained. Bilateral screening digital breast
tomosynthesis was performed. The images were evaluated with
computer-aided detection.

[R CC synth-2D (1 of 2)]
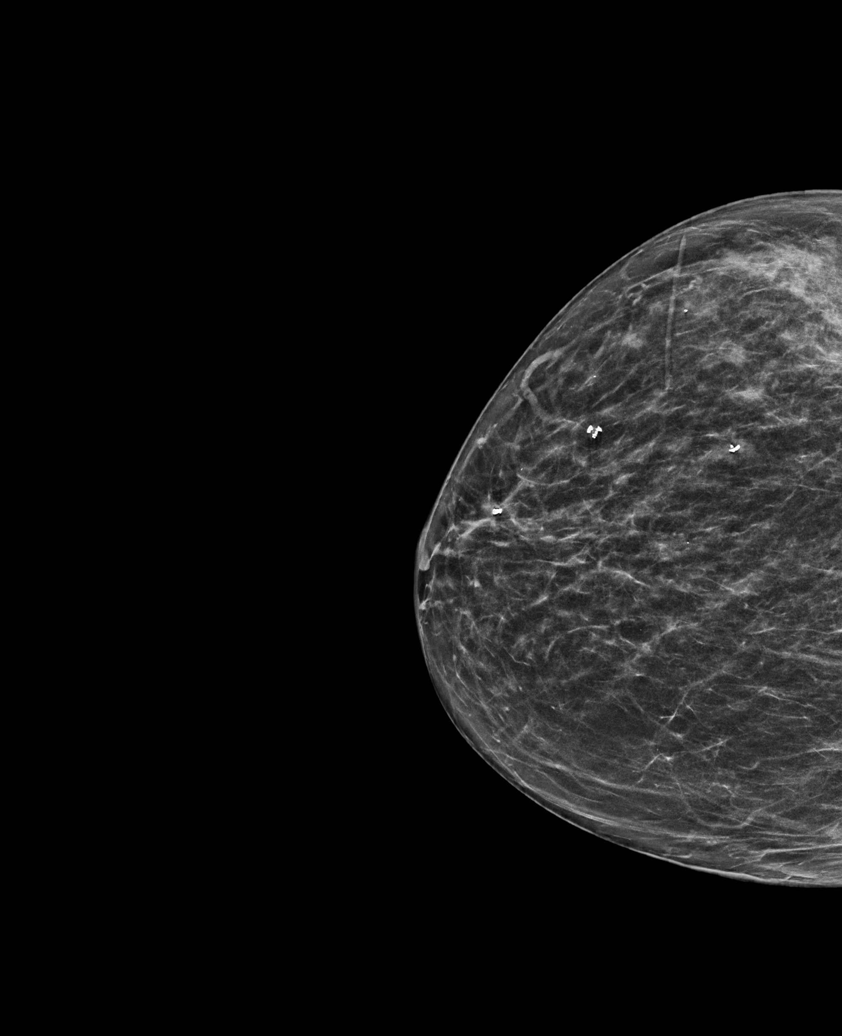

[R CC synth-2D (2 of 2)]
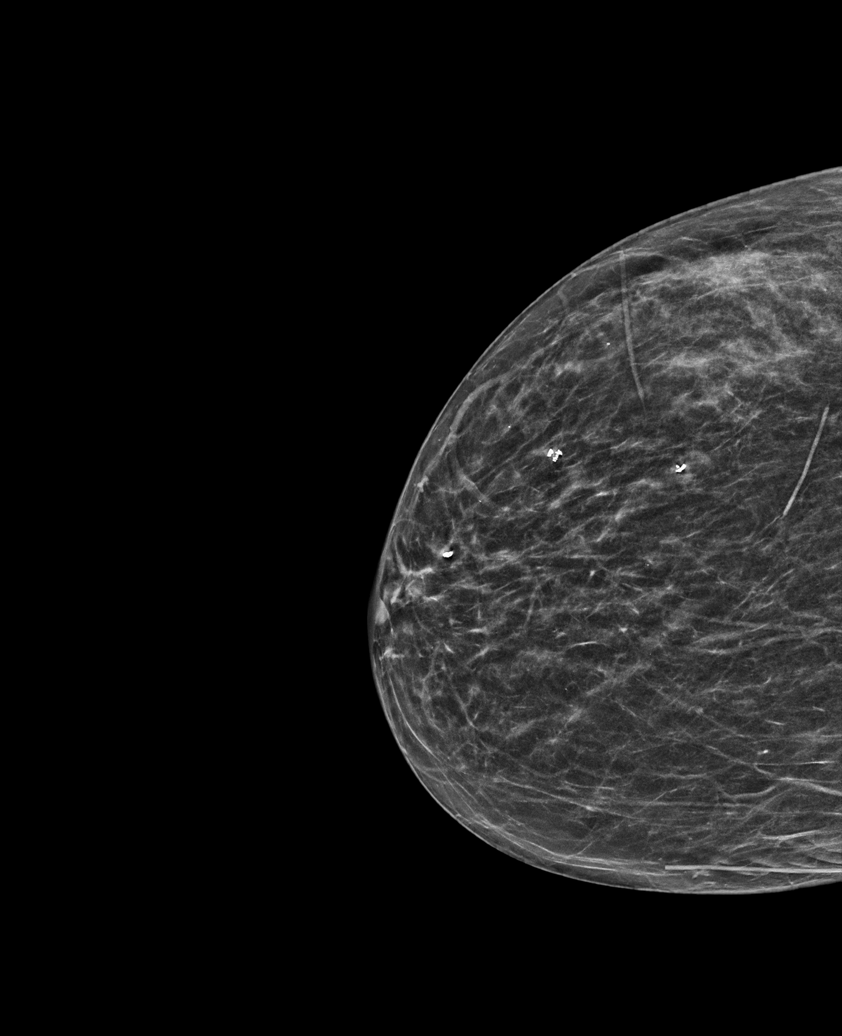

[L CC synth-2D (1 of 2)]
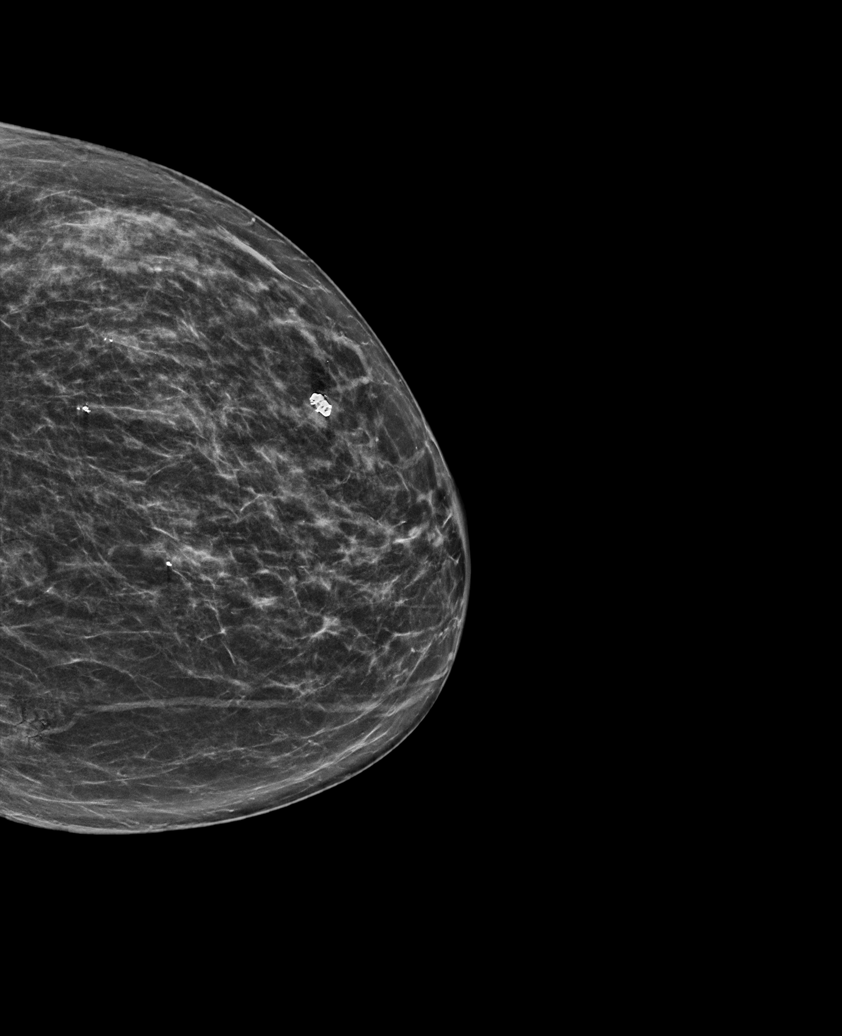

[L MLO synth-2D]
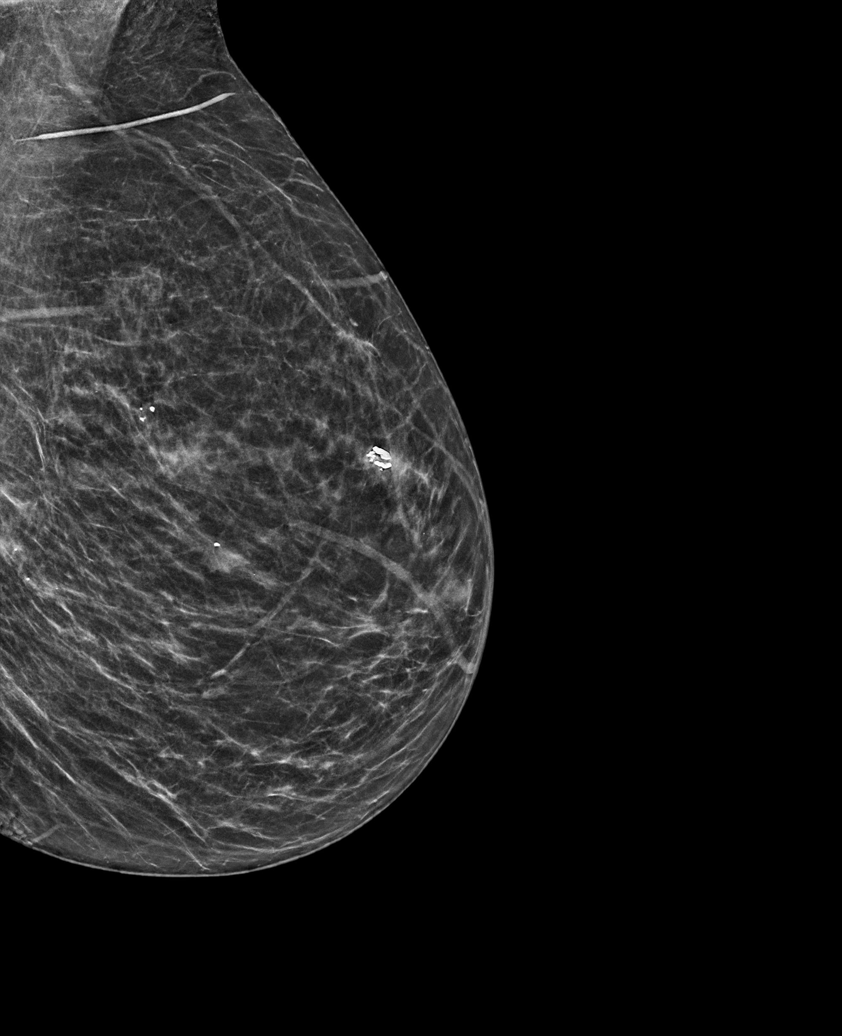

[L CC synth-2D (2 of 2)]
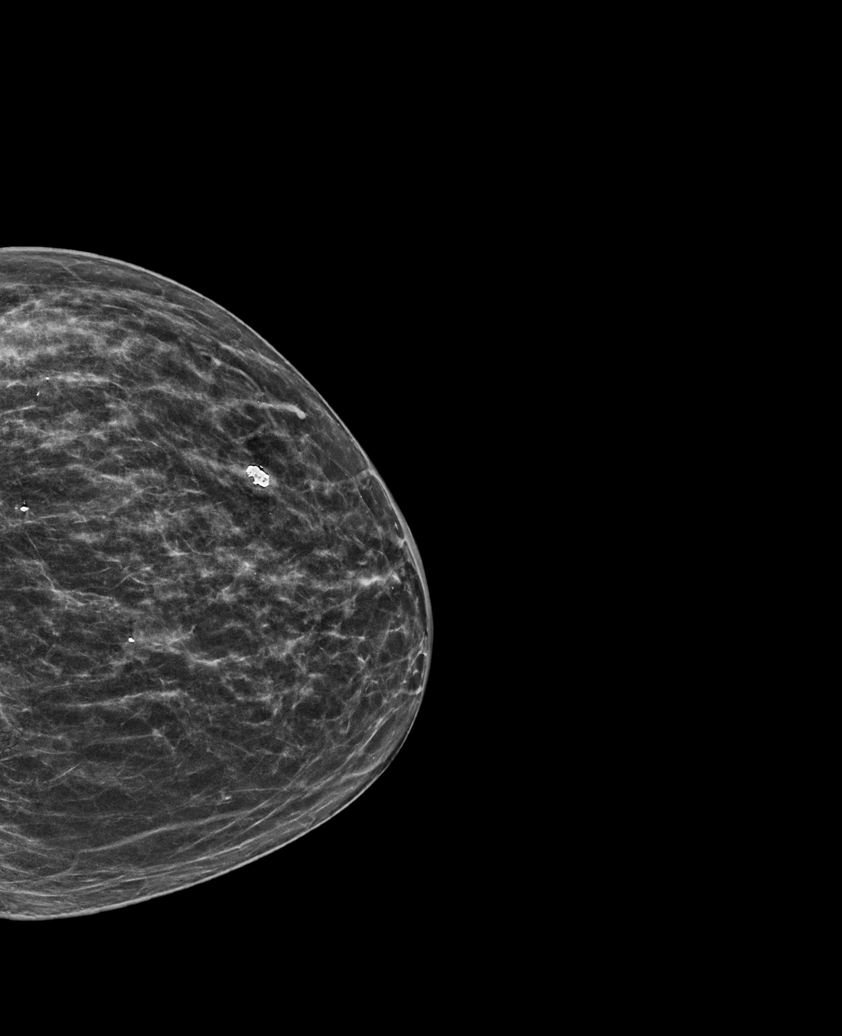

[R MLO synth-2D]
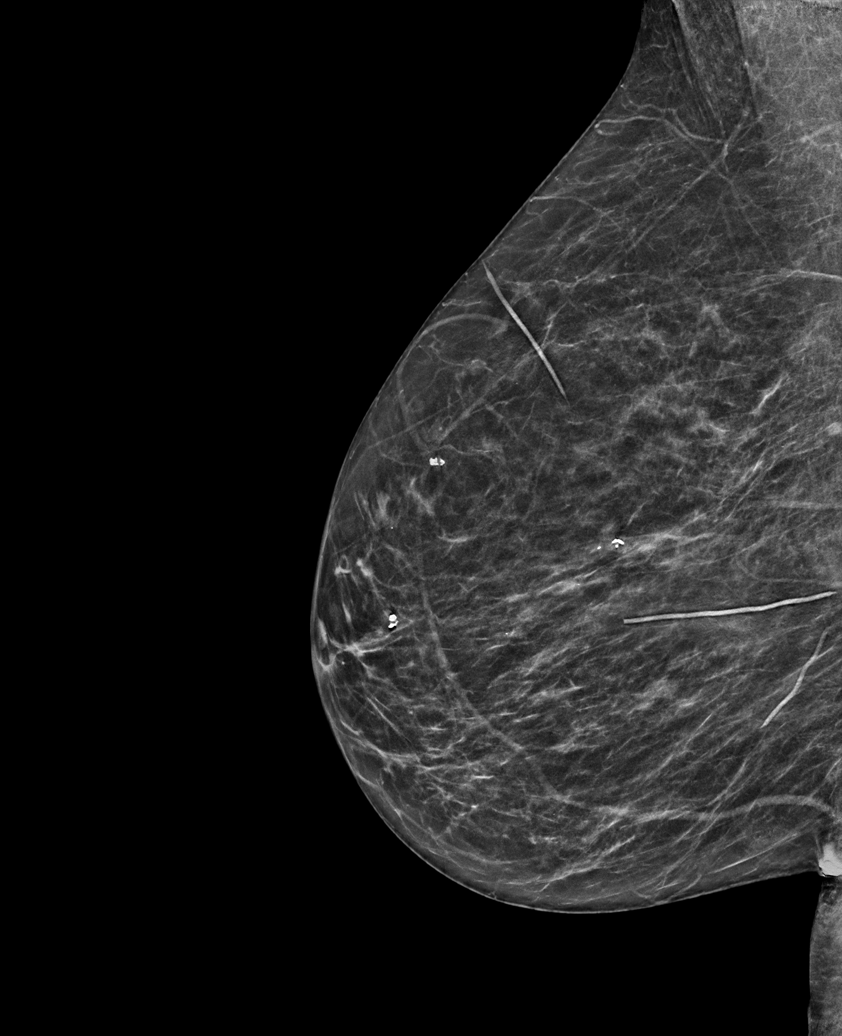

[6 of 36 positions shown; findings below may reference images not displayed]

ACR Breast Density Category b: There are scattered areas of
fibroglandular density.
FINDINGS: There are no findings suspicious for malignancy.
IMPRESSION: No mammographic evidence of malignancy. A result letter of this
screening mammogram will be mailed directly to the patient.

RECOMMENDATION:
Screening mammogram in one year. (Code:51-O-LD2)

BI-RADS CATEGORY  1: Negative.

## 2023-07-20 ENCOUNTER — Encounter: Payer: Medicare (Managed Care) | Admitting: Dermatology

## 2023-07-28 ENCOUNTER — Encounter: Payer: Medicare (Managed Care) | Admitting: Dermatology

## 2023-09-01 ENCOUNTER — Encounter: Payer: Medicare (Managed Care) | Admitting: Dermatology
